# Patient Record
Sex: Female | Born: 1955 | Race: White | Hispanic: No | Marital: Married | Smoking: Never smoker
Health system: Southern US, Community
[De-identification: ages and names within clinical notes are randomized; demographics above are authoritative.]

## PROBLEM LIST (undated history)

## (undated) HISTORY — PX: BREAST BIOPSY: SHX20

## (undated) HISTORY — PX: BREAST EXCISIONAL BIOPSY: SUR124

---

## 2016-03-23 DIAGNOSIS — Z1231 Encounter for screening mammogram for malignant neoplasm of breast: Secondary | ICD-10-CM | POA: Diagnosis not present

## 2016-05-11 DIAGNOSIS — M722 Plantar fascial fibromatosis: Secondary | ICD-10-CM | POA: Diagnosis not present

## 2016-09-20 DIAGNOSIS — Z23 Encounter for immunization: Secondary | ICD-10-CM | POA: Diagnosis not present

## 2017-02-01 DIAGNOSIS — Z0000 Encounter for general adult medical examination without abnormal findings: Secondary | ICD-10-CM | POA: Diagnosis not present

## 2017-04-16 ENCOUNTER — Other Ambulatory Visit: Payer: Self-pay | Admitting: Family Medicine

## 2017-04-16 ENCOUNTER — Other Ambulatory Visit (HOSPITAL_COMMUNITY)
Admission: RE | Admit: 2017-04-16 | Discharge: 2017-04-16 | Disposition: A | Discharge status: Home / Self Care | Payer: BLUE CROSS/BLUE SHIELD | Source: Ambulatory Visit | Attending: Family Medicine | Admitting: Family Medicine

## 2017-04-16 DIAGNOSIS — Z23 Encounter for immunization: Secondary | ICD-10-CM | POA: Diagnosis not present

## 2017-04-16 DIAGNOSIS — Z124 Encounter for screening for malignant neoplasm of cervix: Secondary | ICD-10-CM | POA: Diagnosis not present

## 2017-04-16 DIAGNOSIS — Z0000 Encounter for general adult medical examination without abnormal findings: Secondary | ICD-10-CM | POA: Diagnosis not present

## 2017-04-19 LAB — CYTOLOGY - PAP
Diagnosis: NEGATIVE
HPV: NOT DETECTED

## 2017-05-07 ENCOUNTER — Other Ambulatory Visit: Payer: Self-pay | Admitting: Family Medicine

## 2017-05-07 DIAGNOSIS — Z1231 Encounter for screening mammogram for malignant neoplasm of breast: Secondary | ICD-10-CM

## 2017-05-09 ENCOUNTER — Ambulatory Visit
Admission: RE | Admit: 2017-05-09 | Discharge: 2017-05-09 | Disposition: A | Discharge status: Home / Self Care | Payer: BLUE CROSS/BLUE SHIELD | Source: Ambulatory Visit | Attending: Family Medicine | Admitting: Family Medicine

## 2017-05-09 DIAGNOSIS — Z1231 Encounter for screening mammogram for malignant neoplasm of breast: Secondary | ICD-10-CM

## 2017-09-06 DIAGNOSIS — L738 Other specified follicular disorders: Secondary | ICD-10-CM | POA: Diagnosis not present

## 2017-09-06 DIAGNOSIS — L821 Other seborrheic keratosis: Secondary | ICD-10-CM | POA: Diagnosis not present

## 2017-09-19 DIAGNOSIS — Z23 Encounter for immunization: Secondary | ICD-10-CM | POA: Diagnosis not present

## 2017-12-04 DIAGNOSIS — Z800 Family history of malignant neoplasm of digestive organs: Secondary | ICD-10-CM | POA: Diagnosis not present

## 2018-02-27 DIAGNOSIS — Z0000 Encounter for general adult medical examination without abnormal findings: Secondary | ICD-10-CM | POA: Diagnosis not present

## 2018-02-28 ENCOUNTER — Other Ambulatory Visit: Payer: Self-pay | Admitting: Family Medicine

## 2018-02-28 DIAGNOSIS — Z1231 Encounter for screening mammogram for malignant neoplasm of breast: Secondary | ICD-10-CM

## 2018-04-02 DIAGNOSIS — H11441 Conjunctival cysts, right eye: Secondary | ICD-10-CM | POA: Diagnosis not present

## 2018-04-23 DIAGNOSIS — Z0000 Encounter for general adult medical examination without abnormal findings: Secondary | ICD-10-CM | POA: Diagnosis not present

## 2018-04-23 DIAGNOSIS — E785 Hyperlipidemia, unspecified: Secondary | ICD-10-CM | POA: Diagnosis not present

## 2018-04-23 DIAGNOSIS — Z1211 Encounter for screening for malignant neoplasm of colon: Secondary | ICD-10-CM | POA: Diagnosis not present

## 2018-04-23 DIAGNOSIS — Z1159 Encounter for screening for other viral diseases: Secondary | ICD-10-CM | POA: Diagnosis not present

## 2018-05-10 ENCOUNTER — Ambulatory Visit
Admission: RE | Admit: 2018-05-10 | Discharge: 2018-05-10 | Disposition: A | Discharge status: Home / Self Care | Payer: BLUE CROSS/BLUE SHIELD | Source: Ambulatory Visit | Attending: Family Medicine | Admitting: Family Medicine

## 2018-05-10 DIAGNOSIS — Z1231 Encounter for screening mammogram for malignant neoplasm of breast: Secondary | ICD-10-CM | POA: Diagnosis not present

## 2018-09-04 DIAGNOSIS — Z23 Encounter for immunization: Secondary | ICD-10-CM | POA: Diagnosis not present

## 2018-09-09 DIAGNOSIS — H18832 Recurrent erosion of cornea, left eye: Secondary | ICD-10-CM | POA: Diagnosis not present

## 2018-09-10 DIAGNOSIS — J0100 Acute maxillary sinusitis, unspecified: Secondary | ICD-10-CM | POA: Diagnosis not present

## 2018-09-12 DIAGNOSIS — H18832 Recurrent erosion of cornea, left eye: Secondary | ICD-10-CM | POA: Diagnosis not present

## 2018-09-17 DIAGNOSIS — H18832 Recurrent erosion of cornea, left eye: Secondary | ICD-10-CM | POA: Diagnosis not present

## 2018-10-11 DIAGNOSIS — H18832 Recurrent erosion of cornea, left eye: Secondary | ICD-10-CM | POA: Diagnosis not present

## 2018-10-14 DIAGNOSIS — H18832 Recurrent erosion of cornea, left eye: Secondary | ICD-10-CM | POA: Diagnosis not present

## 2018-10-17 DIAGNOSIS — H18832 Recurrent erosion of cornea, left eye: Secondary | ICD-10-CM | POA: Diagnosis not present

## 2018-10-22 DIAGNOSIS — H18832 Recurrent erosion of cornea, left eye: Secondary | ICD-10-CM | POA: Diagnosis not present

## 2018-11-11 DIAGNOSIS — H18832 Recurrent erosion of cornea, left eye: Secondary | ICD-10-CM | POA: Diagnosis not present

## 2018-11-15 DIAGNOSIS — H18832 Recurrent erosion of cornea, left eye: Secondary | ICD-10-CM | POA: Diagnosis not present

## 2018-12-30 DIAGNOSIS — H18832 Recurrent erosion of cornea, left eye: Secondary | ICD-10-CM | POA: Diagnosis not present

## 2019-02-05 DIAGNOSIS — H18832 Recurrent erosion of cornea, left eye: Secondary | ICD-10-CM | POA: Diagnosis not present

## 2019-07-04 ENCOUNTER — Other Ambulatory Visit: Payer: Self-pay | Admitting: Family Medicine

## 2019-07-04 DIAGNOSIS — Z1231 Encounter for screening mammogram for malignant neoplasm of breast: Secondary | ICD-10-CM

## 2019-07-25 ENCOUNTER — Ambulatory Visit
Admission: RE | Admit: 2019-07-25 | Discharge: 2019-07-25 | Disposition: A | Discharge status: Home / Self Care | Payer: BC Managed Care – PPO | Source: Ambulatory Visit

## 2019-07-25 ENCOUNTER — Other Ambulatory Visit: Payer: Self-pay

## 2019-07-25 DIAGNOSIS — Z1231 Encounter for screening mammogram for malignant neoplasm of breast: Secondary | ICD-10-CM | POA: Diagnosis not present

## 2019-10-08 DIAGNOSIS — Z23 Encounter for immunization: Secondary | ICD-10-CM | POA: Diagnosis not present

## 2019-12-18 DIAGNOSIS — Z1159 Encounter for screening for other viral diseases: Secondary | ICD-10-CM | POA: Diagnosis not present

## 2019-12-23 DIAGNOSIS — K635 Polyp of colon: Secondary | ICD-10-CM | POA: Diagnosis not present

## 2019-12-23 DIAGNOSIS — D120 Benign neoplasm of cecum: Secondary | ICD-10-CM | POA: Diagnosis not present

## 2019-12-23 DIAGNOSIS — D123 Benign neoplasm of transverse colon: Secondary | ICD-10-CM | POA: Diagnosis not present

## 2019-12-23 DIAGNOSIS — Z800 Family history of malignant neoplasm of digestive organs: Secondary | ICD-10-CM | POA: Diagnosis not present

## 2020-01-30 DIAGNOSIS — Z0000 Encounter for general adult medical examination without abnormal findings: Secondary | ICD-10-CM | POA: Diagnosis not present

## 2020-07-26 ENCOUNTER — Other Ambulatory Visit: Payer: Self-pay | Admitting: Family Medicine

## 2020-07-26 ENCOUNTER — Ambulatory Visit
Admission: RE | Admit: 2020-07-26 | Discharge: 2020-07-26 | Disposition: A | Discharge status: Home / Self Care | Payer: BC Managed Care – PPO | Source: Ambulatory Visit | Attending: Family Medicine | Admitting: Family Medicine

## 2020-07-26 ENCOUNTER — Other Ambulatory Visit: Payer: Self-pay

## 2020-07-26 DIAGNOSIS — Z1231 Encounter for screening mammogram for malignant neoplasm of breast: Secondary | ICD-10-CM

## 2020-08-22 DIAGNOSIS — Z20828 Contact with and (suspected) exposure to other viral communicable diseases: Secondary | ICD-10-CM | POA: Diagnosis not present

## 2020-09-18 IMAGING — MG DIGITAL SCREENING BILATERAL MAMMOGRAM WITH TOMO AND CAD
8 series · 8 of 24 positions shown · non-contrast
Comparison: Previous exam(s).

CLINICAL DATA: Screening.

EXAM:
DIGITAL SCREENING BILATERAL MAMMOGRAM WITH TOMO AND CAD

[L MLO synth-2D]
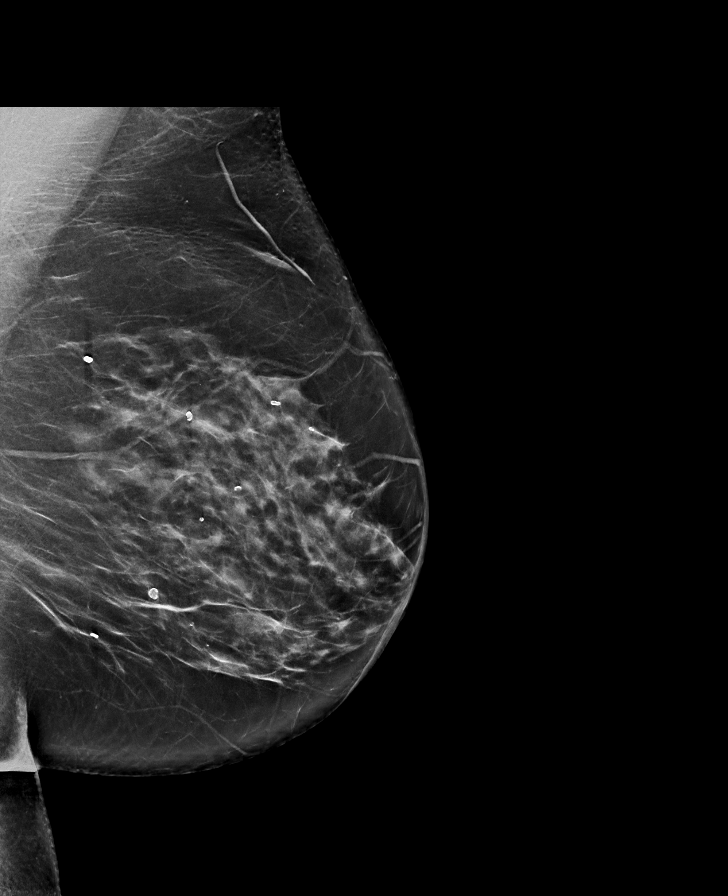

[R CC synth-2D]
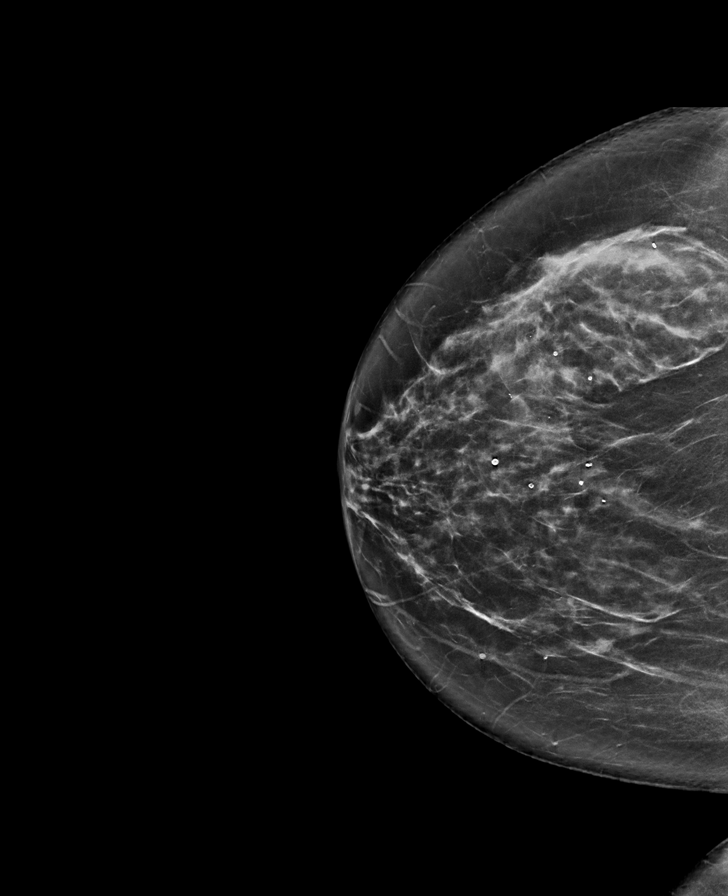

[L CC synth-2D]
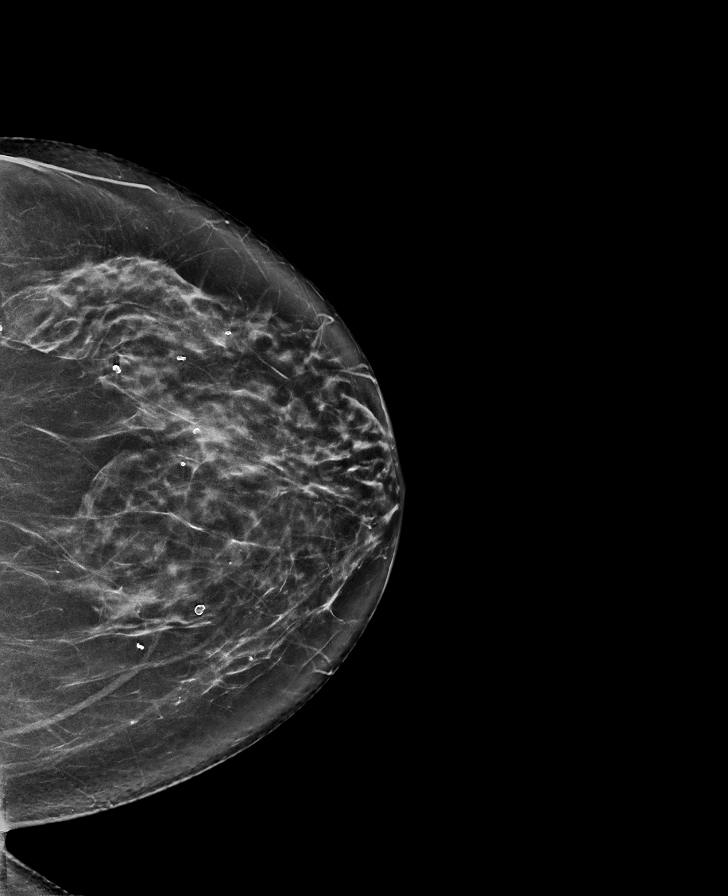

[R MLO synth-2D]
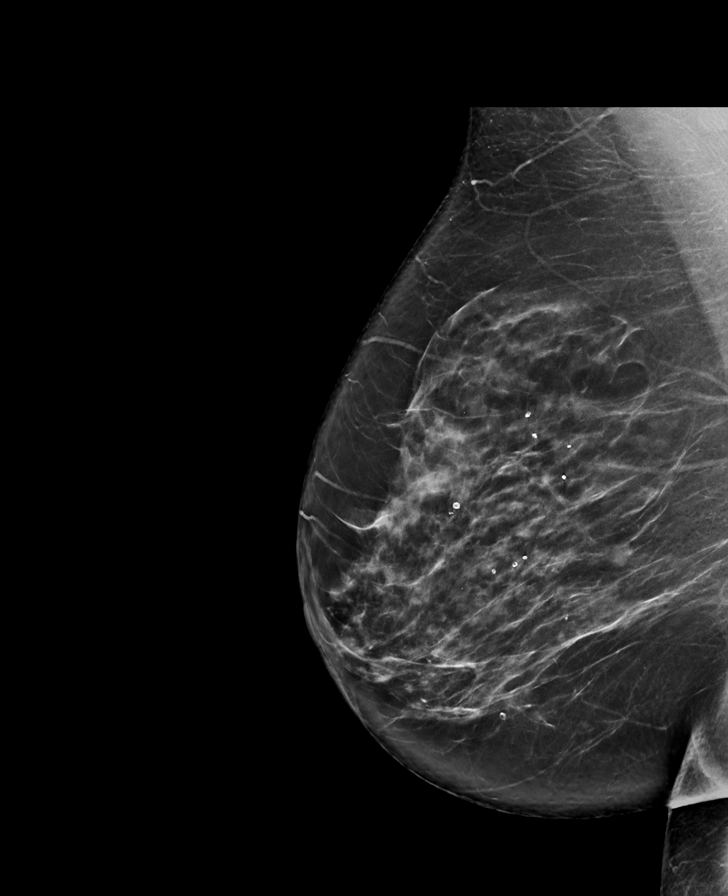

[R MLO tomo · tomo slice 46/91.0]
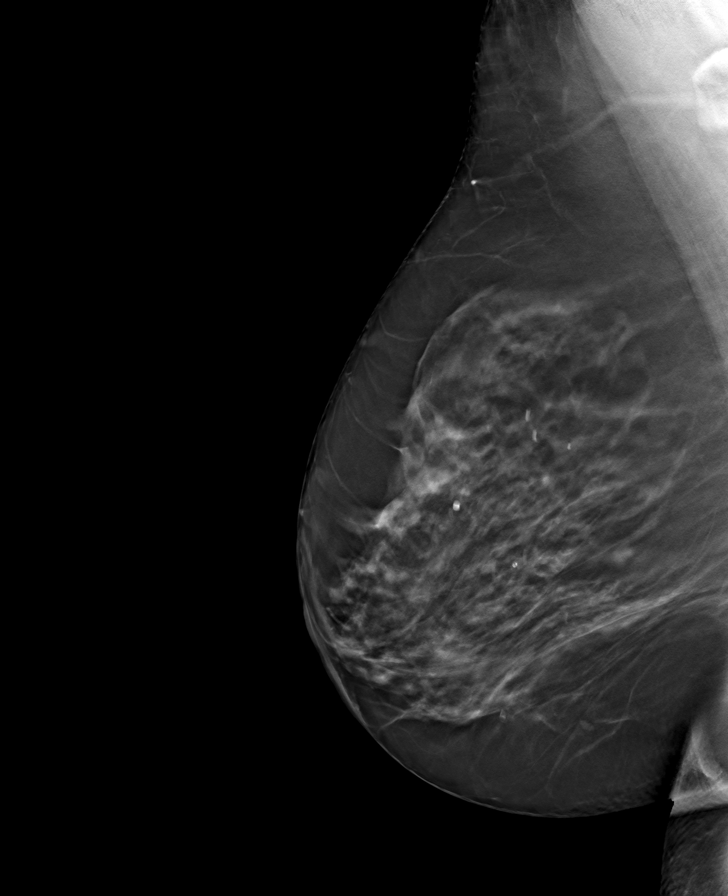

[R CC tomo · tomo slice 39/77.0]
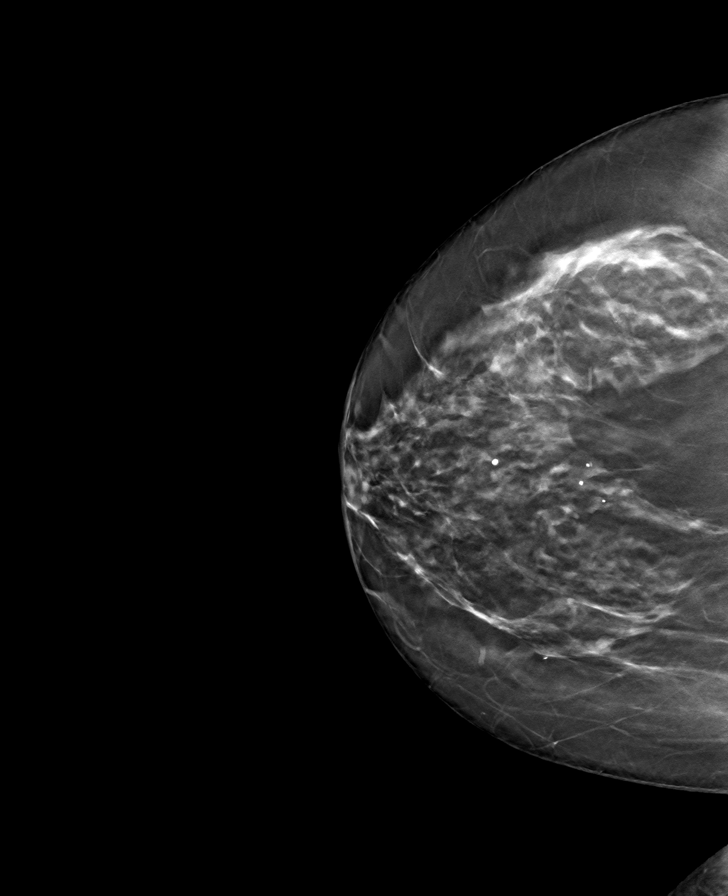

[L MLO tomo · tomo slice 45/88.0]
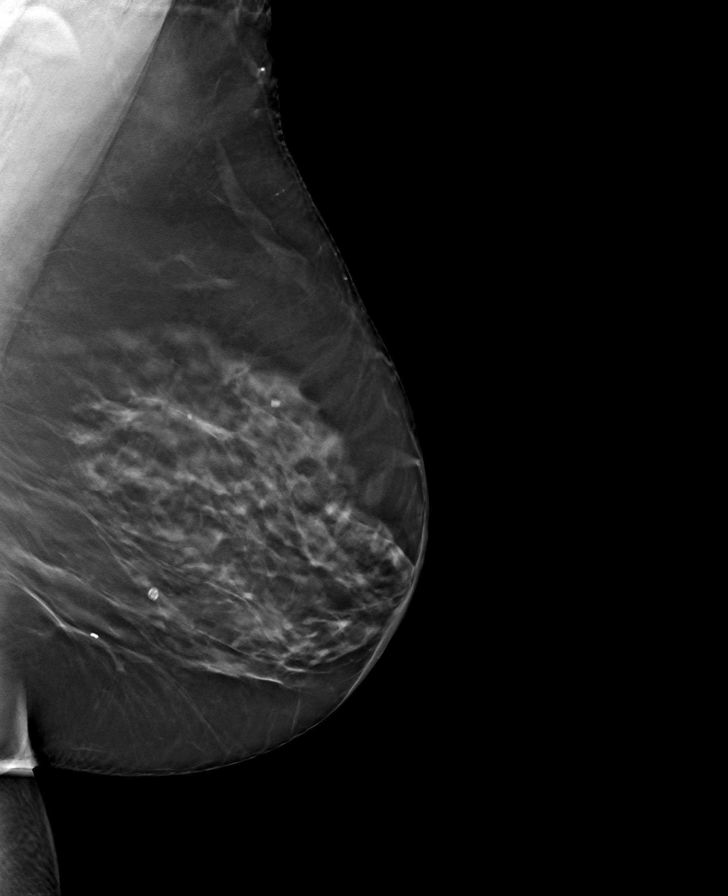

[L CC tomo · tomo slice 39/78.0]
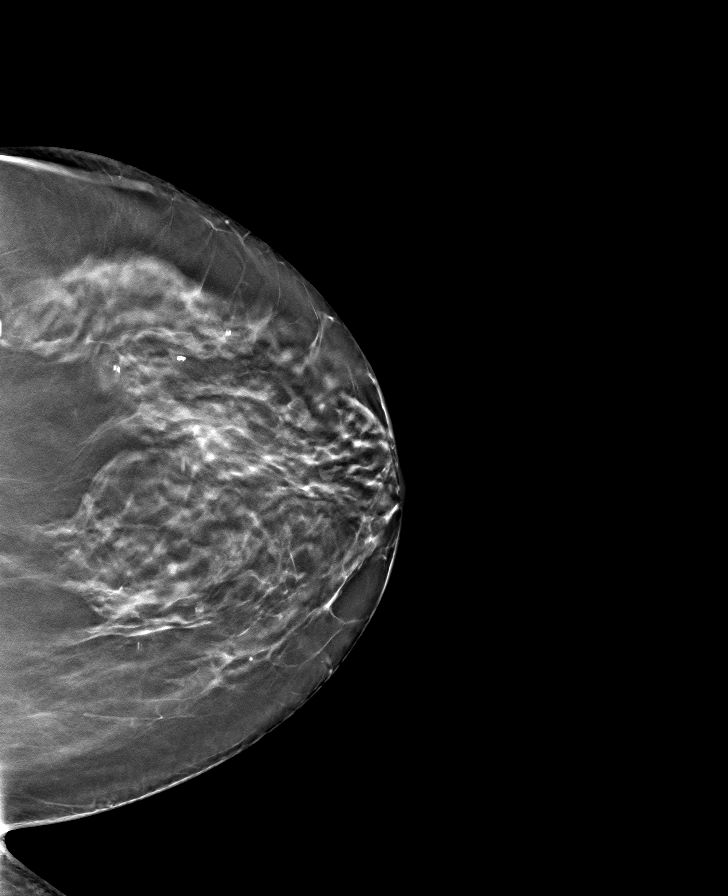

[8 of 24 positions shown; findings below may reference images not displayed]

ACR Breast Density Category c: The breast tissue is heterogeneously
dense, which may obscure small masses.
FINDINGS: There are no findings suspicious for malignancy. Images were
processed with CAD.
IMPRESSION: No mammographic evidence of malignancy. A result letter of this
screening mammogram will be mailed directly to the patient.

RECOMMENDATION:
Screening mammogram in one year. (Code:[id number])

BI-RADS CATEGORY  1: Negative.

## 2020-11-09 DIAGNOSIS — E785 Hyperlipidemia, unspecified: Secondary | ICD-10-CM | POA: Diagnosis not present

## 2021-02-07 DIAGNOSIS — E785 Hyperlipidemia, unspecified: Secondary | ICD-10-CM | POA: Diagnosis not present

## 2021-02-15 DIAGNOSIS — Z0000 Encounter for general adult medical examination without abnormal findings: Secondary | ICD-10-CM | POA: Diagnosis not present

## 2021-02-15 DIAGNOSIS — Z23 Encounter for immunization: Secondary | ICD-10-CM | POA: Diagnosis not present

## 2021-05-25 DIAGNOSIS — Z23 Encounter for immunization: Secondary | ICD-10-CM | POA: Diagnosis not present

## 2021-06-23 ENCOUNTER — Other Ambulatory Visit: Payer: Self-pay | Admitting: Family Medicine

## 2021-06-23 DIAGNOSIS — Z1231 Encounter for screening mammogram for malignant neoplasm of breast: Secondary | ICD-10-CM

## 2021-08-11 ENCOUNTER — Ambulatory Visit
Admission: RE | Admit: 2021-08-11 | Discharge: 2021-08-11 | Disposition: A | Discharge status: Home / Self Care | Payer: BC Managed Care – PPO | Source: Ambulatory Visit

## 2021-08-11 DIAGNOSIS — Z1231 Encounter for screening mammogram for malignant neoplasm of breast: Secondary | ICD-10-CM

## 2021-08-29 DIAGNOSIS — U071 COVID-19: Secondary | ICD-10-CM | POA: Diagnosis not present

## 2021-10-12 DIAGNOSIS — R3915 Urgency of urination: Secondary | ICD-10-CM | POA: Diagnosis not present

## 2021-10-12 DIAGNOSIS — E785 Hyperlipidemia, unspecified: Secondary | ICD-10-CM | POA: Diagnosis not present

## 2022-01-09 ENCOUNTER — Other Ambulatory Visit: Payer: Self-pay

## 2022-01-09 ENCOUNTER — Other Ambulatory Visit (HOSPITAL_BASED_OUTPATIENT_CLINIC_OR_DEPARTMENT_OTHER): Payer: Self-pay | Admitting: Family Medicine

## 2022-01-09 ENCOUNTER — Ambulatory Visit (HOSPITAL_BASED_OUTPATIENT_CLINIC_OR_DEPARTMENT_OTHER)
Admission: RE | Admit: 2022-01-09 | Discharge: 2022-01-09 | Disposition: A | Discharge status: Home / Self Care | Payer: BC Managed Care – PPO | Source: Ambulatory Visit | Attending: Family Medicine | Admitting: Family Medicine

## 2022-01-09 DIAGNOSIS — M79672 Pain in left foot: Secondary | ICD-10-CM | POA: Insufficient documentation

## 2022-01-09 DIAGNOSIS — M25532 Pain in left wrist: Secondary | ICD-10-CM | POA: Insufficient documentation

## 2022-01-09 DIAGNOSIS — R600 Localized edema: Secondary | ICD-10-CM | POA: Diagnosis not present

## 2022-02-22 DIAGNOSIS — Z124 Encounter for screening for malignant neoplasm of cervix: Secondary | ICD-10-CM | POA: Diagnosis not present

## 2022-02-22 DIAGNOSIS — E2839 Other primary ovarian failure: Secondary | ICD-10-CM | POA: Diagnosis not present

## 2022-02-22 DIAGNOSIS — E785 Hyperlipidemia, unspecified: Secondary | ICD-10-CM | POA: Diagnosis not present

## 2022-02-22 DIAGNOSIS — Z0000 Encounter for general adult medical examination without abnormal findings: Secondary | ICD-10-CM | POA: Diagnosis not present

## 2022-02-22 DIAGNOSIS — Z1159 Encounter for screening for other viral diseases: Secondary | ICD-10-CM | POA: Diagnosis not present

## 2022-02-24 ENCOUNTER — Other Ambulatory Visit: Payer: Self-pay | Admitting: Family Medicine

## 2022-02-24 DIAGNOSIS — E2839 Other primary ovarian failure: Secondary | ICD-10-CM

## 2022-04-24 ENCOUNTER — Telehealth: Payer: BC Managed Care – PPO | Admitting: Physician Assistant

## 2022-04-24 DIAGNOSIS — H6523 Chronic serous otitis media, bilateral: Secondary | ICD-10-CM | POA: Diagnosis not present

## 2022-04-24 DIAGNOSIS — R42 Dizziness and giddiness: Secondary | ICD-10-CM | POA: Diagnosis not present

## 2022-04-24 DIAGNOSIS — H8111 Benign paroxysmal vertigo, right ear: Secondary | ICD-10-CM | POA: Diagnosis not present

## 2022-04-24 NOTE — Patient Instructions (Addendum)
  Tanya Christian, thank you for joining Karrie Meres, PA-C for today's virtual visit.  While this provider is not your primary care provider (PCP), if your PCP is located in our provider database this encounter information will be shared with them immediately following your visit.  Consent: (Patient) Tanya Christian provided verbal consent for this virtual visit at the beginning of the encounter.  Current Medications: No current outpatient medications on file.   Medications ordered in this encounter:  No orders of the defined types were placed in this encounter.    *If you need refills on other medications prior to your next appointment, please contact your pharmacy*  Follow-Up:  You have been instructed to have an in-person evaluation today at a local Urgent Care facility, please use the link below. It will take you to a list of all of our available [facility], including address, phone number and hours of operation. Please do not delay care.  [facility]  If you or a family member do not have a primary care provider, use the link below to schedule a visit and establish care. When you choose a [facility] primary care physician or advanced practice provider, you gain a long-term partner in health. Find a Primary Care Provider  Learn more about [facility] in-office and virtual care options: [facility] - Get Care Now

## 2022-04-24 NOTE — Progress Notes (Signed)
Ms. opeyemi, nedrow are scheduled for a virtual visit with your provider today.    Just as we do with appointments in the office, we must obtain your consent to participate.  Your consent will be active for this visit and any virtual visit you may have with one of our providers in the next 365 days.    If you have a MyChart account, I can also send a copy of this consent to you electronically.  All virtual visits are billed to your insurance company just like a traditional visit in the office.  As this is a virtual visit, video technology does not allow for your provider to perform a traditional examination.  This may limit your provider's ability to fully assess your condition.  If your provider identifies any concerns that need to be evaluated in person or the need to arrange testing such as labs, EKG, etc, we will make arrangements to do so.    Although advances in technology are sophisticated, we cannot ensure that it will always work on either your end or our end.  If the connection with a video visit is poor, we may have to switch to a telephone visit.  With either a video or telephone visit, we are not always able to ensure that we have a secure connection.   I need to obtain your verbal consent now.   Are you willing to proceed with your visit today?   Tanya Christian has provided verbal consent on 04/24/2022 for a virtual visit (video or telephone).   Tanya Booze, PA-C 04/24/2022  9:11 AM   Date:  04/24/2022   ID:  Tanya Christian, DOB February 07, 1956, MRN [id number]  Patient Location: Home Provider Location: Home Office   Participants: Patient and Provider for Visit and Wrap up  Method of visit: Video  Location of Patient: Home Location of Provider: Home Office Consent was obtain for visit over the video. Services rendered by provider: Visit was performed via video  A video enabled telemedicine application was used and I verified that I am speaking with the correct person using two  identifiers.  PCP:  Tanya Stalker, PA-C   Chief Complaint:  vertigo  History of Present Illness:    Tanya Christian is a 66 y.o. female with history as stated below. Presents video telehealth for an acute care visit  Pt states she has had vertigo/dizziness that has been intermittent for the last week. She further reports lightheadedness. She states she has felt off balance. She tried taking an antihistamine without relief. Does note that symptoms seem to be present mainly with movement. The symptoms do improve at rest. She denies visual changes, unilateral numbness/weakness, chest pain, sob, falls/trauma. Does not some right facial pressure last week that she states may be due to sinuses.  Past Medical, Surgical, Social History, Allergies, and Medications have been Reviewed.  History reviewed. No pertinent past medical history.  No outpatient medications have been marked as taking for the 04/24/22 encounter (Video Visit) with [facility].     Allergies:   Patient has no allergy information on record.   ROS See HPI for history of present illness.  Physical Exam Constitutional:      Appearance: Normal appearance.  Neurological:     Mental Status: She is alert.     Comments: Clear speech, no facial droop             MDM: pt with dizziness intermittently for 1 week. Has hx hld. Sxs sounds  possibly consistent with BPPV but I cannot complete a reliable neuro exam and do not have access to ancillary testing via video visit to confidently r/o central cause of symptoms. Have advised that she seek care in person for further eval. She is agreeable to f/u.  There are no diagnoses linked to this encounter.   Time:   Today, I have spent 10 minutes with the patient with telehealth technology discussing the above problems, reviewing the chart, previous notes, medications and orders.    Tests Ordered: No orders of the defined types were placed in this  encounter.   Medication Changes: No orders of the defined types were placed in this encounter.    Disposition:  Follow up  Signed, Tanya Booze, PA-C  04/24/2022 9:11 AM

## 2022-06-30 ENCOUNTER — Other Ambulatory Visit: Payer: Self-pay | Admitting: Family Medicine

## 2022-06-30 DIAGNOSIS — Z1231 Encounter for screening mammogram for malignant neoplasm of breast: Secondary | ICD-10-CM

## 2022-08-10 DIAGNOSIS — Z23 Encounter for immunization: Secondary | ICD-10-CM | POA: Diagnosis not present

## 2022-09-01 ENCOUNTER — Ambulatory Visit
Admission: RE | Admit: 2022-09-01 | Discharge: 2022-09-01 | Disposition: A | Discharge status: Home / Self Care | Payer: BC Managed Care – PPO | Source: Ambulatory Visit | Attending: Family Medicine | Admitting: Family Medicine

## 2022-09-01 DIAGNOSIS — Z1231 Encounter for screening mammogram for malignant neoplasm of breast: Secondary | ICD-10-CM

## 2022-09-01 DIAGNOSIS — M8589 Other specified disorders of bone density and structure, multiple sites: Secondary | ICD-10-CM | POA: Diagnosis not present

## 2022-09-01 DIAGNOSIS — E2839 Other primary ovarian failure: Secondary | ICD-10-CM

## 2022-09-01 DIAGNOSIS — Z780 Asymptomatic menopausal state: Secondary | ICD-10-CM | POA: Diagnosis not present

## 2023-04-02 ENCOUNTER — Emergency Department (HOSPITAL_BASED_OUTPATIENT_CLINIC_OR_DEPARTMENT_OTHER): Payer: Medicare PPO

## 2023-04-02 ENCOUNTER — Emergency Department (HOSPITAL_BASED_OUTPATIENT_CLINIC_OR_DEPARTMENT_OTHER)
Admission: EM | Admit: 2023-04-02 | Discharge: 2023-04-02 | Disposition: A | Discharge status: Home / Self Care | Payer: Medicare PPO | Attending: Emergency Medicine | Admitting: Emergency Medicine

## 2023-04-02 ENCOUNTER — Encounter (HOSPITAL_BASED_OUTPATIENT_CLINIC_OR_DEPARTMENT_OTHER): Payer: Self-pay | Admitting: Emergency Medicine

## 2023-04-02 ENCOUNTER — Other Ambulatory Visit: Payer: Self-pay

## 2023-04-02 ENCOUNTER — Emergency Department (HOSPITAL_BASED_OUTPATIENT_CLINIC_OR_DEPARTMENT_OTHER): Payer: Medicare PPO | Admitting: Radiology

## 2023-04-02 DIAGNOSIS — S8002XA Contusion of left knee, initial encounter: Secondary | ICD-10-CM

## 2023-04-02 DIAGNOSIS — S0990XA Unspecified injury of head, initial encounter: Secondary | ICD-10-CM | POA: Insufficient documentation

## 2023-04-02 DIAGNOSIS — M542 Cervicalgia: Secondary | ICD-10-CM | POA: Diagnosis not present

## 2023-04-02 DIAGNOSIS — W0110XA Fall on same level from slipping, tripping and stumbling with subsequent striking against unspecified object, initial encounter: Secondary | ICD-10-CM | POA: Diagnosis not present

## 2023-04-02 MED ORDER — ACETAMINOPHEN 325 MG PO TABS
650.0000 mg | ORAL_TABLET | Freq: Once | ORAL | Status: AC
  Administered 2023-04-02: 650 mg via ORAL
  Filled 2023-04-02: qty 2

## 2023-04-02 NOTE — ED Provider Notes (Signed)
[facility] AT [facility] Provider Note   CSN: [id number] Arrival date & time: 04/02/23  1223     History  Chief Complaint  Patient presents with   Head Injury    Tanya Christian is a 67 y.o. female.  Patient is a 67 year old female with no significant medical history presenting today with a head injury.  Patient was taking the garbage down on the driveway and slipped and fell backward hitting her head on the concrete and thinks the garbage may have fallen on top of her.  She denies any loss of consciousness, vision changes, nausea or vomiting but does have a 6 out of 10 headache.  She takes no anticoagulation.  Also having soreness in her neck.  Also complaining of pain in her left knee.  She was able to stand and ambulate.  Also reports pain seems to radiate down into her jaws  The history is provided by the patient.  Head Injury Location:  Occipital      Home Medications Prior to Admission medications   Not on File      Allergies    Patient has no known allergies.    Review of Systems   Review of Systems  Physical Exam Updated Vital Signs BP (!) 144/84   Pulse 65   Temp 97.9 F (36.6 C) (Oral)   Resp 11   Ht 5\' 7"  (1.702 m)   Wt 72.6 kg   SpO2 98%   BMI 25.06 kg/m  Physical Exam Vitals and nursing note reviewed.  Constitutional:      General: She is not in acute distress.    Appearance: She is well-developed.  HENT:     Head: Normocephalic.     Jaw: There is normal jaw occlusion.   Eyes:     Conjunctiva/sclera: Conjunctivae normal.     Pupils: Pupils are equal, round, and reactive to light.  Cardiovascular:     Rate and Rhythm: Normal rate and regular rhythm.     Heart sounds: No murmur heard. Pulmonary:     Effort: Pulmonary effort is normal. No respiratory distress.     Breath sounds: Normal breath sounds. No wheezing or rales.  Abdominal:     General: There is no distension.     Palpations: Abdomen is soft.      Tenderness: There is no abdominal tenderness. There is no guarding or rebound.  Musculoskeletal:        General: Tenderness present. Normal range of motion.     Cervical back: Normal range of motion and neck supple. Muscular tenderness present. No spinous process tenderness.     Left knee: Ecchymosis present. Normal range of motion. Tenderness present over the patellar tendon.  Skin:    General: Skin is warm and dry.     Findings: No erythema or rash.  Neurological:     Mental Status: She is alert and oriented to person, place, and time. Mental status is at baseline.     Sensory: No sensory deficit.     Motor: No weakness.     Gait: Gait normal.  Psychiatric:        Behavior: Behavior normal.     ED Results / Procedures / Treatments   Labs (all labs ordered are listed, but only abnormal results are displayed) Labs Reviewed - No data to display  EKG None  Radiology CT Head Wo Contrast  Result Date: 04/02/2023 CLINICAL DATA:  Fall this morning. Patient hit posterior of head. Denies  loss of consciousness. EXAM: CT HEAD WITHOUT CONTRAST CT CERVICAL SPINE WITHOUT CONTRAST TECHNIQUE: Multidetector CT imaging of the head and cervical spine was performed following the standard protocol without intravenous contrast. Multiplanar CT image reconstructions of the cervical spine were also generated. RADIATION DOSE REDUCTION: This exam was performed according to the departmental dose-optimization program which includes automated exposure control, adjustment of the mA and/or kV according to patient size and/or use of iterative reconstruction technique. COMPARISON:  None Available. FINDINGS: CT HEAD FINDINGS Brain: No evidence of acute infarction, hemorrhage, hydrocephalus, extra-axial collection or mass lesion/mass effect. Vascular: No hyperdense vessel or unexpected calcification. Skull: Normal. Negative for fracture or focal lesion. Sinuses/Orbits: No acute finding. Other: None. CT CERVICAL SPINE  FINDINGS Alignment: Loss of normal cervical lordosis Skull base and vertebrae: No acute fracture. No primary bone lesion or focal pathologic process. Soft tissues and spinal canal: No prevertebral fluid or swelling. No visible canal hematoma. Disc levels: C2-C3:  No significant findings C3-C4: Moderate left facet joint arthropathy. No significant spinal canal or neural foraminal stenosis. C4-C5: Disc height loss with uncovertebral joint arthropathy. No significant spinal canal or neural foraminal stenosis. C5-C6: Disc height loss and vacuum disc phenomena. Mild uncovertebral joint arthropathy. No significant spinal canal or neural foraminal stenosis. C6-C7: Disc height loss and uncovertebral joint arthropathy. No significant spinal canal or neural foraminal stenosis. C7-T1:  No significant findings. Upper chest: Negative. Other: None IMPRESSION: CT HEAD: No acute intracranial abnormality. CT CERVICAL SPINE: 1. No acute fracture or traumatic subluxation. 2. 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 Electronically Signed   By: Larose Hires D.O.   On: 04/02/2023 13:35   CT Cervical Spine Wo Contrast  Result Date: 04/02/2023 CLINICAL DATA:  Fall this morning. Patient hit posterior of head. Denies loss of consciousness. EXAM: CT HEAD WITHOUT CONTRAST CT CERVICAL SPINE WITHOUT CONTRAST TECHNIQUE: Multidetector CT imaging of the head and cervical spine was performed following the standard protocol without intravenous contrast. Multiplanar CT image reconstructions of the cervical spine were also generated. RADIATION DOSE REDUCTION: This exam was performed according to the departmental dose-optimization program which includes automated exposure control, adjustment of the mA and/or kV according to patient size and/or use of iterative reconstruction technique. COMPARISON:  None Available. FINDINGS: CT HEAD FINDINGS Brain: No evidence of acute infarction, hemorrhage, hydrocephalus,  extra-axial collection or mass lesion/mass effect. Vascular: No hyperdense vessel or unexpected calcification. Skull: Normal. Negative for fracture or focal lesion. Sinuses/Orbits: No acute finding. Other: None. CT CERVICAL SPINE FINDINGS Alignment: Loss of normal cervical lordosis Skull base and vertebrae: No acute fracture. No primary bone lesion or focal pathologic process. Soft tissues and spinal canal: No prevertebral fluid or swelling. No visible canal hematoma. Disc levels: C2-C3:  No significant findings C3-C4: Moderate left facet joint arthropathy. No significant spinal canal or neural foraminal stenosis. C4-C5: Disc height loss with uncovertebral joint arthropathy. No significant spinal canal or neural foraminal stenosis. C5-C6: Disc height loss and vacuum disc phenomena. Mild uncovertebral joint arthropathy. No significant spinal canal or neural foraminal stenosis. C6-C7: Disc height loss and uncovertebral joint arthropathy. No significant spinal canal or neural foraminal stenosis. C7-T1:  No significant findings. Upper chest: Negative. Other: None IMPRESSION: CT HEAD: No acute intracranial abnormality. CT CERVICAL SPINE: 1. No acute fracture or traumatic subluxation. 2. 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 Electronically Signed   By: Larose Hires D.O.   On: 04/02/2023 13:35   DG Knee Complete 4 Views Left  Result Date: 04/02/2023 CLINICAL DATA:  Fall with medial left knee bruising EXAM: LEFT KNEE - COMPLETE 4 VIEW COMPARISON:  None Available. FINDINGS: No evidence of fracture, dislocation, or joint effusion. No evidence of arthropathy or other focal bone abnormality. Soft tissues are unremarkable. IMPRESSION: No acute fracture or dislocation. Electronically Signed   By: Agustin Cree M.D.   On: 04/02/2023 13:30    Procedures Procedures    Medications Ordered in ED Medications  acetaminophen (TYLENOL) tablet 650 mg (650 mg Oral Given 04/02/23 1308)    ED  Course/ Medical Decision Making/ A&P                             Medical Decision Making Amount and/or Complexity of Data Reviewed Radiology: ordered and independent interpretation performed. Decision-making details documented in ED Course.  Risk OTC drugs.   Pt presenting today with a complaint that caries a high risk for morbidity and mortality.  Here today after a fall when she hit her head on the concrete.  Patient is awake and mentating normally at this time.  No focal neurologic findings.  Does have contusion on her occipital area of her scalp.  Will do a CT to rule out intracranial hemorrhage.  Also having some neck pain and left knee pain.  Imaging pending.  Patient given Tylenol for pain.  I have independently visualized and interpreted pt's images today.  CT of the head without evidence of intracranial hemorrhage and CT cervical spine without acute fracture.  Plain films of the knee were negative.  Radiology report no acute findings but did have multiple levels of arthritis in the neck.  Findings discussed with the patient.  She was given return precautions.  No indication for further imaging at this time.  Patient discharged home in good condition.         Final Clinical Impression(s) / ED Diagnoses Final diagnoses:  Injury of head, initial encounter  Contusion of left knee, initial encounter    Rx / DC Orders ED Discharge Orders     None         Gwyneth Sprout, MD 04/02/23 1357

## 2023-04-02 NOTE — ED Triage Notes (Signed)
Pt via pov from home after falling this morning taking the trash down the driveway. She hit the posterior of her head; denies loc; not on blood thinners. She reports that she has generalized  head and jaw pain. Pt alert & oriented, nad noted.

## 2023-04-02 NOTE — Discharge Instructions (Signed)
It is okay to take Tylenol as needed for headaches.  Make sure you are getting plenty of rest avoid strenuous activity.  If you are still having headache and symptoms 1 week out it would be a good idea to follow-up with the concussion clinic or your regular doctor.  Return if you start having vision changes or persistent vomiting.

## 2023-04-02 NOTE — ED Notes (Signed)
Discharge instructions, follow up care, and pain management reviewed and explained, pt verbalized understanding and had no further questions on d/c. Pt caox4, ambulatory, NAD on d/c.  

## 2023-08-09 DIAGNOSIS — N898 Other specified noninflammatory disorders of vagina: Secondary | ICD-10-CM | POA: Diagnosis not present

## 2023-08-09 DIAGNOSIS — Z6829 Body mass index (BMI) 29.0-29.9, adult: Secondary | ICD-10-CM | POA: Diagnosis not present

## 2023-08-30 ENCOUNTER — Other Ambulatory Visit: Payer: Self-pay | Admitting: Family Medicine

## 2023-08-30 DIAGNOSIS — Z1231 Encounter for screening mammogram for malignant neoplasm of breast: Secondary | ICD-10-CM

## 2023-09-24 DIAGNOSIS — Z6829 Body mass index (BMI) 29.0-29.9, adult: Secondary | ICD-10-CM | POA: Diagnosis not present

## 2023-09-24 DIAGNOSIS — J069 Acute upper respiratory infection, unspecified: Secondary | ICD-10-CM | POA: Diagnosis not present

## 2023-09-26 ENCOUNTER — Ambulatory Visit
Admission: RE | Admit: 2023-09-26 | Discharge: 2023-09-26 | Disposition: A | Discharge status: Home / Self Care | Payer: Medicare PPO | Source: Ambulatory Visit | Attending: Family Medicine | Admitting: Family Medicine

## 2023-09-26 DIAGNOSIS — Z1231 Encounter for screening mammogram for malignant neoplasm of breast: Secondary | ICD-10-CM | POA: Diagnosis not present

## 2023-09-28 DIAGNOSIS — N958 Other specified menopausal and perimenopausal disorders: Secondary | ICD-10-CM | POA: Diagnosis not present

## 2023-09-28 DIAGNOSIS — N898 Other specified noninflammatory disorders of vagina: Secondary | ICD-10-CM | POA: Diagnosis not present

## 2023-09-28 DIAGNOSIS — N9489 Other specified conditions associated with female genital organs and menstrual cycle: Secondary | ICD-10-CM | POA: Diagnosis not present

## 2023-10-30 DIAGNOSIS — N952 Postmenopausal atrophic vaginitis: Secondary | ICD-10-CM | POA: Diagnosis not present

## 2023-10-30 DIAGNOSIS — Z01419 Encounter for gynecological examination (general) (routine) without abnormal findings: Secondary | ICD-10-CM | POA: Diagnosis not present

## 2024-01-07 DIAGNOSIS — H2513 Age-related nuclear cataract, bilateral: Secondary | ICD-10-CM | POA: Diagnosis not present

## 2024-03-26 DIAGNOSIS — Z0000 Encounter for general adult medical examination without abnormal findings: Secondary | ICD-10-CM | POA: Diagnosis not present

## 2024-03-26 DIAGNOSIS — M8580 Other specified disorders of bone density and structure, unspecified site: Secondary | ICD-10-CM | POA: Diagnosis not present

## 2024-03-26 DIAGNOSIS — E785 Hyperlipidemia, unspecified: Secondary | ICD-10-CM | POA: Diagnosis not present

## 2024-03-26 DIAGNOSIS — E2839 Other primary ovarian failure: Secondary | ICD-10-CM | POA: Diagnosis not present

## 2024-03-27 ENCOUNTER — Encounter: Payer: Self-pay | Admitting: Family Medicine

## 2024-03-27 ENCOUNTER — Other Ambulatory Visit: Payer: Self-pay | Admitting: Family Medicine

## 2024-03-27 DIAGNOSIS — E2839 Other primary ovarian failure: Secondary | ICD-10-CM

## 2024-03-31 DIAGNOSIS — E785 Hyperlipidemia, unspecified: Secondary | ICD-10-CM | POA: Diagnosis not present

## 2024-03-31 DIAGNOSIS — M8580 Other specified disorders of bone density and structure, unspecified site: Secondary | ICD-10-CM | POA: Diagnosis not present

## 2024-05-15 ENCOUNTER — Other Ambulatory Visit: Payer: Self-pay | Admitting: Family Medicine

## 2024-05-15 DIAGNOSIS — Z1231 Encounter for screening mammogram for malignant neoplasm of breast: Secondary | ICD-10-CM

## 2024-07-22 DIAGNOSIS — E559 Vitamin D deficiency, unspecified: Secondary | ICD-10-CM | POA: Diagnosis not present

## 2024-07-22 DIAGNOSIS — E785 Hyperlipidemia, unspecified: Secondary | ICD-10-CM | POA: Diagnosis not present

## 2024-08-12 DIAGNOSIS — Z23 Encounter for immunization: Secondary | ICD-10-CM | POA: Diagnosis not present

## 2024-09-26 ENCOUNTER — Ambulatory Visit
Admission: RE | Admit: 2024-09-26 | Discharge: 2024-09-26 | Disposition: A | Discharge status: Home / Self Care | Source: Ambulatory Visit | Attending: Family Medicine | Admitting: Family Medicine

## 2024-09-26 DIAGNOSIS — Z1231 Encounter for screening mammogram for malignant neoplasm of breast: Secondary | ICD-10-CM

## 2024-10-31 DIAGNOSIS — Z01419 Encounter for gynecological examination (general) (routine) without abnormal findings: Secondary | ICD-10-CM | POA: Diagnosis not present

## 2024-10-31 DIAGNOSIS — N898 Other specified noninflammatory disorders of vagina: Secondary | ICD-10-CM | POA: Diagnosis not present

## 2024-10-31 DIAGNOSIS — N958 Other specified menopausal and perimenopausal disorders: Secondary | ICD-10-CM | POA: Diagnosis not present

## 2024-10-31 DIAGNOSIS — Z7251 High risk heterosexual behavior: Secondary | ICD-10-CM | POA: Diagnosis not present

## 2024-10-31 DIAGNOSIS — L292 Pruritus vulvae: Secondary | ICD-10-CM | POA: Diagnosis not present

## 2024-10-31 DIAGNOSIS — L731 Pseudofolliculitis barbae: Secondary | ICD-10-CM | POA: Diagnosis not present

## 2024-10-31 DIAGNOSIS — L900 Lichen sclerosus et atrophicus: Secondary | ICD-10-CM | POA: Diagnosis not present
